# Patient Record
Sex: Female | Born: 1985 | Race: White | Hispanic: No | Smoking: Never smoker
Health system: Southern US, Community
[De-identification: ages and names within clinical notes are randomized; demographics above are authoritative.]

---

## 2019-05-03 DIAGNOSIS — Z03818 Encounter for observation for suspected exposure to other biological agents ruled out: Secondary | ICD-10-CM | POA: Diagnosis not present

## 2019-07-17 ENCOUNTER — Ambulatory Visit: Payer: Self-pay | Attending: Internal Medicine

## 2019-07-17 DIAGNOSIS — Z23 Encounter for immunization: Secondary | ICD-10-CM

## 2019-07-17 NOTE — Progress Notes (Signed)
   Covid-19 Vaccination Clinic  Name:  Jennifer Duke    MRN: [id number] DOB: 1985/07/15  07/17/2019  Jennifer Duke was observed post Covid-19 immunization for 15 minutes without incident. She was provided with Vaccine Information Sheet and instruction to access the V-Safe system.   Jennifer Duke was instructed to call 911 with any severe reactions post vaccine: Marland Kitchen Difficulty breathing  . Swelling of face and throat  . A fast heartbeat  . A bad rash all over body  . Dizziness and weakness   Immunizations Administered    Name Date Dose VIS Date Route   Pfizer COVID-19 Vaccine 07/17/2019  5:47 PM 0.3 mL 04/08/2019 Intramuscular   Manufacturer: [employer], [employer]   Lot: [id number]   NDC: [address]-7

## 2019-08-07 ENCOUNTER — Ambulatory Visit: Payer: Self-pay | Attending: Internal Medicine

## 2019-08-07 DIAGNOSIS — Z23 Encounter for immunization: Secondary | ICD-10-CM

## 2019-08-07 NOTE — Progress Notes (Signed)
   Covid-19 Vaccination Clinic  Name:  Jennifer Duke    MRN: [id number] DOB: 1985/09/26  08/07/2019  Jennifer Duke was observed post Covid-19 immunization for 15 minutes without incident. She was provided with Vaccine Information Sheet and instruction to access the V-Safe system.   Jennifer Duke was instructed to call 911 with any severe reactions post vaccine: Marland Kitchen Difficulty breathing  . Swelling of face and throat  . A fast heartbeat  . A bad rash all over body  . Dizziness and weakness   Immunizations Administered    Name Date Dose VIS Date Route   Pfizer COVID-19 Vaccine 08/07/2019  5:31 PM 0.3 mL 04/08/2019 Intramuscular   Manufacturer: [employer], [employer]   Lot: [id number]   NDC: [address]-9

## 2020-05-03 DIAGNOSIS — U071 COVID-19: Secondary | ICD-10-CM | POA: Diagnosis not present

## 2020-05-16 DIAGNOSIS — R059 Cough, unspecified: Secondary | ICD-10-CM | POA: Diagnosis not present

## 2020-05-16 DIAGNOSIS — J40 Bronchitis, not specified as acute or chronic: Secondary | ICD-10-CM | POA: Diagnosis not present

## 2020-06-22 DIAGNOSIS — Z713 Dietary counseling and surveillance: Secondary | ICD-10-CM | POA: Diagnosis not present

## 2020-06-22 DIAGNOSIS — Z13220 Encounter for screening for lipoid disorders: Secondary | ICD-10-CM | POA: Diagnosis not present

## 2020-06-22 DIAGNOSIS — E669 Obesity, unspecified: Secondary | ICD-10-CM | POA: Diagnosis not present

## 2020-06-22 DIAGNOSIS — Z9989 Dependence on other enabling machines and devices: Secondary | ICD-10-CM | POA: Diagnosis not present

## 2020-06-22 DIAGNOSIS — Z0000 Encounter for general adult medical examination without abnormal findings: Secondary | ICD-10-CM | POA: Diagnosis not present

## 2020-06-22 DIAGNOSIS — Z23 Encounter for immunization: Secondary | ICD-10-CM | POA: Diagnosis not present

## 2020-06-22 DIAGNOSIS — J984 Other disorders of lung: Secondary | ICD-10-CM | POA: Diagnosis not present

## 2020-06-29 DIAGNOSIS — Z01419 Encounter for gynecological examination (general) (routine) without abnormal findings: Secondary | ICD-10-CM | POA: Diagnosis not present

## 2020-07-02 DIAGNOSIS — M25511 Pain in right shoulder: Secondary | ICD-10-CM | POA: Diagnosis not present

## 2020-07-02 NOTE — Progress Notes (Addendum)
07/03/20- 34 yoF never smoker for sleep evaluation courtesy of Aliene Beams, MD with concern of OSA on CPAP Epworth score- Body weight today- Covid vax-3 Phizer Flu vax-none -----Patient wears CPAP already but moved here from [place] and needs to get established with DR and DME. Patient feels like she is not sleeping good, states that she feels exhausted through out the day. States she has not been able to use machine because she needs new supplies. Had been successful with CPAP but nasal pillows masks have worn out. Without CPAP now sleep is not restful and she is taking 2 naps/ day.  No sleep meds. 3 cups coffee. She denies ENT surgery, heart, lung or neurologic problems and is not pregnant. We are requesting original diagnostic study from [facility]. Denies other parasomnias. Female partner tells her of snoring and apneas.  We reviewed sleep apnea, testing and treatment and answered questions.        Apparently at her first study she was told her breathing was somehow "restricted". This was not a PFT and she isn't sure what was meant, but she does not have known lung disease. Apparently this comment related to some aspect of her sleep study- possibly hypopneas. We can evaluate further if needed.  "Self-employed" day shift work.   Addendum 07/10/20- NPSG from  [facility]- AHI 3.1/ hr, desaturation to 92%, body weight 178 lbs. Advice was for conservative emanagement  Prior to Admission medications   Not on File   History reviewed. No pertinent past medical history. History reviewed. No pertinent surgical history. Frankey Shown Social History   Socioeconomic History  . Marital status: Significant Other    Spouse name: Not on file  . Number of children: Not on file  . Years of education: Not on file  . Highest education level: Not on file  Occupational History  . Not on file  Tobacco Use  . Smoking status: Never Smoker  . Smokeless tobacco: Never Used   Vaping Use  . Vaping Use: Never used  Substance and Sexual Activity  . Alcohol use: Not on file  . Drug use: Not on file  . Sexual activity: Not on file  Other Topics Concern  . Not on file  Social History Narrative  . Not on file   Social Determinants of Health   Financial Resource Strain: Not on file  Food Insecurity: Not on file  Transportation Needs: Not on file  Physical Activity: Not on file  Stress: Not on file  Social Connections: Not on file  Intimate Partner Violence: Not on file   ROS-see HPI   + = positive Constitutional:    weight loss, night sweats, fevers, chills, +fatigue, lassitude. HEENT:    headaches, difficulty swallowing, tooth/dental problems, sore throat,       sneezing, itching, ear ache, nasal congestion, post nasal drip, snoring CV:    chest pain, orthopnea, PND, swelling in lower extremities, anasarca,                                  dizziness, palpitations Resp:   shortness of breath with exertion or at rest.                productive cough,   non-productive cough, coughing up of blood.              change in color of mucus.  wheezing.   Skin:  rash or lesions. GI:  No-   heartburn, indigestion, abdominal pain, nausea, vomiting, diarrhea,                 change in bowel habits, loss of appetite GU: dysuria, change in color of urine, no urgency or frequency.   flank pain. MS:   joint pain, stiffness, decreased range of motion, back pain. Neuro-     nothing unusual Psych:  change in mood or affect.  depression or anxiety.   memory loss.  OBJ- Physical Exam General- Alert, Oriented, Affect-appropriate, Distress- none acute Skin- rash-none, lesions- none, excoriation- none Lymphadenopathy- none Head- atraumatic            Eyes- Gross vision intact, PERRLA, conjunctivae and secretions clear            Ears- Hearing, canals-normal            Nose- Clear, no-Septal dev, mucus, polyps, erosion, perforation             Throat- Mallampati III ,  mucosa clear , drainage- none,  tonsils- atrophic, + teeth Neck- flexible , trachea midline, no stridor , thyroid nl, carotid no bruit Chest - symmetrical excursion , unlabored           Heart/CV- RRR , no murmur , no gallop  , no rub, nl s1 s2                           - JVD- none , edema- none, stasis changes- none, varices- none           Lung- clear to P&A, wheeze- none, cough- none , dullness-none, rub- none           Chest wall-  Abd-  Br/ Gen/ Rectal- Not done, not indicated Extrem- cyanosis- none, clubbing, none, atrophy- none, strength- nl Neuro- grossly intact to observation

## 2020-07-03 ENCOUNTER — Encounter: Payer: Self-pay | Admitting: Internal Medicine

## 2020-07-03 ENCOUNTER — Other Ambulatory Visit: Payer: Self-pay

## 2020-07-03 ENCOUNTER — Ambulatory Visit: Payer: BC Managed Care – PPO | Admitting: Internal Medicine

## 2020-07-03 DIAGNOSIS — G4733 Obstructive sleep apnea (adult) (pediatric): Secondary | ICD-10-CM | POA: Insufficient documentation

## 2020-07-03 NOTE — Patient Instructions (Signed)
Order- please schedule home sleep test dx OSA  Please call us about 2 weeks after your sleep test to see if results and recommendations are ready yet. If appropriate, we may be able to start treatment before we see you next.  We will be able then to get you a local DME company and replace your old CPAP machine.  Order- referral to sleep center for mask fitting  Please call anytime if we can help

## 2020-07-03 NOTE — Assessment & Plan Note (Signed)
Relationship of body weight to OSA was reviewed. She reports weight gain 10-15 lbs in recent years, but has started going to a gym. Plan- encourage diet and exercise

## 2020-07-03 NOTE — Assessment & Plan Note (Signed)
Original test result from [place] is being requested. She has not been using CPAP because she lacked DME /source for replacement masks. She will need to update documentation and can do that with a home study. We discussed supply chain problems delaying DME supply of new and replacement machines. To speed things up, we will have mask-fitting done so hopefully she can use a mask from that service until HST/ new DME/ new CPAP. We did discuss responsibility for driving safety, alternatives to CPAP therapy.

## 2020-08-06 DIAGNOSIS — M25511 Pain in right shoulder: Secondary | ICD-10-CM | POA: Diagnosis not present

## 2020-08-31 DIAGNOSIS — D485 Neoplasm of uncertain behavior of skin: Secondary | ICD-10-CM | POA: Diagnosis not present

## 2020-08-31 DIAGNOSIS — I788 Other diseases of capillaries: Secondary | ICD-10-CM | POA: Diagnosis not present

## 2020-08-31 DIAGNOSIS — D225 Melanocytic nevi of trunk: Secondary | ICD-10-CM | POA: Diagnosis not present

## 2020-08-31 DIAGNOSIS — D2261 Melanocytic nevi of right upper limb, including shoulder: Secondary | ICD-10-CM | POA: Diagnosis not present

## 2020-08-31 DIAGNOSIS — D2262 Melanocytic nevi of left upper limb, including shoulder: Secondary | ICD-10-CM | POA: Diagnosis not present

## 2020-08-31 DIAGNOSIS — L814 Other melanin hyperpigmentation: Secondary | ICD-10-CM | POA: Diagnosis not present

## 2020-09-13 DIAGNOSIS — M25562 Pain in left knee: Secondary | ICD-10-CM | POA: Diagnosis not present

## 2020-09-13 DIAGNOSIS — M25511 Pain in right shoulder: Secondary | ICD-10-CM | POA: Diagnosis not present

## 2020-09-25 DIAGNOSIS — L988 Other specified disorders of the skin and subcutaneous tissue: Secondary | ICD-10-CM | POA: Diagnosis not present

## 2020-09-25 DIAGNOSIS — D485 Neoplasm of uncertain behavior of skin: Secondary | ICD-10-CM | POA: Diagnosis not present

## 2020-10-23 DIAGNOSIS — J40 Bronchitis, not specified as acute or chronic: Secondary | ICD-10-CM | POA: Diagnosis not present

## 2020-11-01 ENCOUNTER — Other Ambulatory Visit: Payer: Self-pay | Admitting: *Deleted

## 2020-11-01 DIAGNOSIS — G4733 Obstructive sleep apnea (adult) (pediatric): Secondary | ICD-10-CM

## 2020-11-01 NOTE — Progress Notes (Unsigned)
.  hst

## 2020-11-02 ENCOUNTER — Ambulatory Visit: Payer: BC Managed Care – PPO | Admitting: Internal Medicine

## 2020-12-12 DIAGNOSIS — G4733 Obstructive sleep apnea (adult) (pediatric): Secondary | ICD-10-CM | POA: Diagnosis not present

## 2021-01-02 DIAGNOSIS — R053 Chronic cough: Secondary | ICD-10-CM | POA: Diagnosis not present

## 2021-01-03 ENCOUNTER — Other Ambulatory Visit: Payer: Self-pay | Admitting: Family Medicine

## 2021-01-03 ENCOUNTER — Ambulatory Visit
Admission: RE | Admit: 2021-01-03 | Discharge: 2021-01-03 | Disposition: A | Discharge status: Home / Self Care | Payer: BC Managed Care – PPO | Source: Ambulatory Visit | Attending: Family Medicine | Admitting: Family Medicine

## 2021-01-03 DIAGNOSIS — R053 Chronic cough: Secondary | ICD-10-CM

## 2021-01-03 DIAGNOSIS — R059 Cough, unspecified: Secondary | ICD-10-CM | POA: Diagnosis not present

## 2021-01-09 ENCOUNTER — Telehealth: Payer: Self-pay | Admitting: Internal Medicine

## 2021-01-09 NOTE — Telephone Encounter (Signed)
Ok with me 

## 2021-01-09 NOTE — Telephone Encounter (Signed)
Pt is wanting to change offices from [place] to [place].  CY and KK please advise if provider change is ok with you both.  Thanks

## 2021-01-10 NOTE — Telephone Encounter (Signed)
Noted.   Will route to [place] to f/u for scheduling at next business day.

## 2021-01-11 NOTE — Telephone Encounter (Signed)
Lm for patient to schedule OV with Dr. Belia Heman.

## 2021-01-14 NOTE — Telephone Encounter (Signed)
Appt scheduled for 01/31/2021 at 3:00.  Patient is aware and voiced her understanding.  Nothing further needed at this time.

## 2021-01-31 ENCOUNTER — Encounter: Payer: Self-pay | Admitting: Internal Medicine

## 2021-01-31 ENCOUNTER — Other Ambulatory Visit: Payer: Self-pay

## 2021-01-31 ENCOUNTER — Ambulatory Visit: Payer: BC Managed Care – PPO | Admitting: Internal Medicine

## 2021-01-31 VITALS — BP 126/80 | HR 79 | Temp 98.5°F | Ht 63.0 in | Wt 207.8 lb

## 2021-01-31 DIAGNOSIS — R059 Cough, unspecified: Secondary | ICD-10-CM | POA: Diagnosis not present

## 2021-01-31 MED ORDER — ADVAIR HFA 230-21 MCG/ACT IN AERO: 2.0000 | INHALATION_SPRAY | Freq: Two times a day (BID) | RESPIRATORY_TRACT | 4 refills | Status: DC

## 2021-01-31 NOTE — Progress Notes (Signed)
Chief complaint Patient here for assessment for chronic cough   HPI Patient previously seen by Dr. Fannie Knee and lumbar pulmonary office patient has a previous history of sleep apnea However repeat sleep test with home sleep study has been ordered but not completed Patient sees [facility] in [place] for her underlying hypopneas and is on CPAP therapy  Addendum 07/10/20- NPSG from  05/02/15- [facility]- AHI 3.1/ hr, desaturation to 92%, body weight 178 lbs. Advice was for conservative emanagement  Patient been diagnosed with a viral infection back in November 2021  She had symptoms of sinus congestion and was treated with Z-Pak and prednisone which helped she then developed a persistent cough that is nonproductive in nature intermittently over the last 10 months  At this time she has a persistent dry cough No fevers No active signs of infection Nonproductive cough Family history of CHF  No pets or animals at home Cleans her house very regularly She has not tried any type of inhalers in the past  Chest x-ray September 2022 Independently reviewed by me today No significant findings  Non-smoker No secondhand smoke exposure Self-employed works at home Alcohol use 2-5 drinks per week Episodic GERD   Social History   Socioeconomic History   Marital status: Significant Other    Spouse name: Not on file   Number of children: Not on file   Years of education: Not on file   Highest education level: Not on file  Occupational History   Not on file  Tobacco Use   Smoking status: Never   Smokeless tobacco: Never  Vaping Use   Vaping Use: Never used  Substance and Sexual Activity   Alcohol use: Not on file   Drug use: Not on file   Sexual activity: Not on file  Other Topics Concern   Not on file  Social History Narrative   Not on file   Social Determinants of Health   Financial Resource Strain: Not on file  Food Insecurity: Not on file   Transportation Needs: Not on file  Physical Activity: Not on file  Stress: Not on file  Social Connections: Not on file  Intimate Partner Violence: Not on file    Review of Systems:  Gen:  Denies  fever, sweats, chills weight loss  HEENT: Denies blurred vision, double vision, ear pain, eye pain, hearing loss, nose bleeds, sore throat Cardiac:  No dizziness, chest pain or heaviness, chest tightness,edema, No JVD Resp:+cough, -sputum production, -shortness of breath,-wheezing, -hemoptysis,  Other:  All other systems negative  BP 126/80 (BP Location: Left Arm, Patient Position: Sitting, Cuff Size: Normal)   Pulse 79   Temp 98.5 F (36.9 C) (Oral)   Ht 5\' 3"  (1.6 m)   Wt 207 lb 12.8 oz (94.3 kg)   SpO2 98%   BMI 36.81 kg/m    Physical Examination:   General Appearance: No distress  EYES PERRLA, EOM intact.   NECK Supple, No JVD Pulmonary: normal breath sounds, No wheezing.  CardiovascularNormal S1,S2.  No m/r/g.   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  [facility]  Medical  Director [facility] Medical Director [facility] Cardio-Pulmonary Department

## 2021-01-31 NOTE — Patient Instructions (Signed)
START ADVAIR HFA inhaler therapy  Avoid allergens and second hand smoke

## 2021-03-07 ENCOUNTER — Telehealth: Payer: Self-pay

## 2021-03-07 NOTE — Telephone Encounter (Signed)
Called and LVM in regards to upcoming appointment, would like for her to bring in SD card to CPAP is possible on 11/15.

## 2021-03-12 ENCOUNTER — Other Ambulatory Visit: Payer: Self-pay

## 2021-03-12 ENCOUNTER — Ambulatory Visit: Payer: BC Managed Care – PPO | Admitting: Internal Medicine

## 2021-03-12 ENCOUNTER — Encounter: Payer: Self-pay | Admitting: Internal Medicine

## 2021-03-12 VITALS — BP 124/62 | HR 72 | Temp 98.4°F | Ht 63.0 in | Wt 210.2 lb

## 2021-03-12 DIAGNOSIS — J411 Mucopurulent chronic bronchitis: Secondary | ICD-10-CM

## 2021-03-12 NOTE — Progress Notes (Signed)
SYNOPSIS Patient previously seen by Dr. Fannie Knee and lumbar pulmonary office patient has a previous history of sleep apnea However repeat sleep test with home sleep study has been ordered but not completed Patient sees Jennifer Duke in [place] for her underlying hypopneas and is on CPAP therapy  Addendum 07/10/20- NPSG from  05/02/15- [facility]- AHI 3.1/ hr, desaturation to 92%, body weight 178 lbs. Advice was for conservative emanagement  Patient been diagnosed with a viral infection back in November 2021  She had symptoms of sinus congestion and was treated with Z-Pak and prednisone which helped she then developed a persistent cough that is nonproductive in nature intermittently over the last 10 months  At this time she has a persistent dry cough No fevers No active signs of infection Nonproductive cough Family history of CHF  No pets or animals at home Cleans her house very regularly She has not tried any type of inhalers in the past  Chest x-ray September 2022 Independently reviewed by me today No significant findings  Non-smoker No secondhand smoke exposure Self-employed works at home Alcohol use 2-5 drinks per week Episodic GERD   CC follow up cough  HPI Patient started on ADVAIR HFA 4 weeks ago She feels so much better with her cough Less cough Less SOB Feels much better   No exacerbation at this time No evidence of heart failure at this time No evidence or signs of infection at this time No respiratory distress No fevers, chills, nausea, vomiting, diarrhea No evidence of lower extremity edema No evidence hemoptysis   OFFICE SPIRO SEEMS TO BE WNL  Social History   Socioeconomic History   Marital status: Significant Other    Spouse name: Not on file   Number of children: Not on file   Years of education: Not on file   Highest education level: Not on file  Occupational History   Not on file  Tobacco Use   Smoking status:  Never   Smokeless tobacco: Never  Vaping Use   Vaping Use: Never used  Substance and Sexual Activity   Alcohol use: Not on file   Drug use: Not on file   Sexual activity: Not on file  Other Topics Concern   Not on file  Social History Narrative   Not on file   Social Determinants of Health   Financial Resource Strain: Not on file  Food Insecurity: Not on file  Transportation Needs: Not on file  Physical Activity: Not on file  Stress: Not on file  Social Connections: Not on file  Intimate Partner Violence: Not on file     Review of Systems:  Gen:  Denies  fever, sweats, chills weight loss  HEENT: Denies blurred vision, double vision, ear pain, eye pain, hearing loss, nose bleeds, sore throat Cardiac:  No dizziness, chest pain or heaviness, chest tightness,edema, No JVD Resp:   No cough, -sputum production, -shortness of breath,-wheezing, -hemoptysis,  Other:  All other systems negative   BP 124/62 (BP Location: Left Arm, Patient Position: Sitting, Cuff Size: Normal)   Pulse 72   Temp 98.4 F (36.9 C) (Oral)   Ht 5\' 3"  (1.6 m)   Wt 210 lb 3.2 oz (95.3 kg)   SpO2 99%   BMI 37.24 kg/m    Physical Examination:   General Appearance: No distress  EYES PERRLA, EOM intact.   NECK Supple, No JVD Pulmonary: normal breath sounds, No wheezing.  CardiovascularNormal S1,S2.  No m/r/g.  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  [facility]   Medical Director [facility] Medical Director [facility] Cardio-Pulmonary Department

## 2021-03-12 NOTE — Patient Instructions (Addendum)
Continue AVDAIR AS NEEDED AVOID ALLERGENS

## 2021-07-02 DIAGNOSIS — Z01419 Encounter for gynecological examination (general) (routine) without abnormal findings: Secondary | ICD-10-CM | POA: Diagnosis not present

## 2021-07-02 DIAGNOSIS — Z124 Encounter for screening for malignant neoplasm of cervix: Secondary | ICD-10-CM | POA: Diagnosis not present

## 2021-07-05 DIAGNOSIS — M7918 Myalgia, other site: Secondary | ICD-10-CM | POA: Diagnosis not present

## 2021-07-05 DIAGNOSIS — Z13220 Encounter for screening for lipoid disorders: Secondary | ICD-10-CM | POA: Diagnosis not present

## 2021-07-05 DIAGNOSIS — Z0000 Encounter for general adult medical examination without abnormal findings: Secondary | ICD-10-CM | POA: Diagnosis not present

## 2021-09-03 DIAGNOSIS — D692 Other nonthrombocytopenic purpura: Secondary | ICD-10-CM | POA: Diagnosis not present

## 2021-09-03 DIAGNOSIS — D2261 Melanocytic nevi of right upper limb, including shoulder: Secondary | ICD-10-CM | POA: Diagnosis not present

## 2021-09-03 DIAGNOSIS — D2262 Melanocytic nevi of left upper limb, including shoulder: Secondary | ICD-10-CM | POA: Diagnosis not present

## 2021-09-03 DIAGNOSIS — L578 Other skin changes due to chronic exposure to nonionizing radiation: Secondary | ICD-10-CM | POA: Diagnosis not present

## 2021-10-11 DIAGNOSIS — L237 Allergic contact dermatitis due to plants, except food: Secondary | ICD-10-CM | POA: Diagnosis not present

## 2022-02-28 DIAGNOSIS — R051 Acute cough: Secondary | ICD-10-CM | POA: Diagnosis not present

## 2022-03-03 DIAGNOSIS — R051 Acute cough: Secondary | ICD-10-CM | POA: Diagnosis not present

## 2022-03-03 DIAGNOSIS — Z20822 Contact with and (suspected) exposure to covid-19: Secondary | ICD-10-CM | POA: Diagnosis not present

## 2022-05-06 DIAGNOSIS — T161XXA Foreign body in right ear, initial encounter: Secondary | ICD-10-CM | POA: Diagnosis not present

## 2022-06-18 DIAGNOSIS — F331 Major depressive disorder, recurrent, moderate: Secondary | ICD-10-CM | POA: Diagnosis not present

## 2022-06-18 DIAGNOSIS — F411 Generalized anxiety disorder: Secondary | ICD-10-CM | POA: Diagnosis not present

## 2022-07-09 DIAGNOSIS — F411 Generalized anxiety disorder: Secondary | ICD-10-CM | POA: Diagnosis not present

## 2022-07-09 DIAGNOSIS — F331 Major depressive disorder, recurrent, moderate: Secondary | ICD-10-CM | POA: Diagnosis not present

## 2022-08-14 DIAGNOSIS — Z30433 Encounter for removal and reinsertion of intrauterine contraceptive device: Secondary | ICD-10-CM | POA: Diagnosis not present

## 2022-08-14 DIAGNOSIS — Z01419 Encounter for gynecological examination (general) (routine) without abnormal findings: Secondary | ICD-10-CM | POA: Diagnosis not present

## 2022-08-14 DIAGNOSIS — Z3202 Encounter for pregnancy test, result negative: Secondary | ICD-10-CM | POA: Diagnosis not present

## 2022-08-21 DIAGNOSIS — R5383 Other fatigue: Secondary | ICD-10-CM | POA: Diagnosis not present

## 2022-08-21 DIAGNOSIS — Z0000 Encounter for general adult medical examination without abnormal findings: Secondary | ICD-10-CM | POA: Diagnosis not present

## 2022-08-22 DIAGNOSIS — F411 Generalized anxiety disorder: Secondary | ICD-10-CM | POA: Diagnosis not present

## 2022-08-22 DIAGNOSIS — Z630 Problems in relationship with spouse or partner: Secondary | ICD-10-CM | POA: Diagnosis not present

## 2022-08-25 DIAGNOSIS — F411 Generalized anxiety disorder: Secondary | ICD-10-CM | POA: Diagnosis not present

## 2022-08-25 DIAGNOSIS — F331 Major depressive disorder, recurrent, moderate: Secondary | ICD-10-CM | POA: Diagnosis not present

## 2022-09-08 DIAGNOSIS — Z630 Problems in relationship with spouse or partner: Secondary | ICD-10-CM | POA: Diagnosis not present

## 2022-09-08 DIAGNOSIS — F411 Generalized anxiety disorder: Secondary | ICD-10-CM | POA: Diagnosis not present

## 2022-09-23 DIAGNOSIS — F331 Major depressive disorder, recurrent, moderate: Secondary | ICD-10-CM | POA: Diagnosis not present

## 2022-09-23 DIAGNOSIS — Z630 Problems in relationship with spouse or partner: Secondary | ICD-10-CM | POA: Diagnosis not present

## 2022-09-23 DIAGNOSIS — F411 Generalized anxiety disorder: Secondary | ICD-10-CM | POA: Diagnosis not present

## 2022-09-25 DIAGNOSIS — Z30431 Encounter for routine checking of intrauterine contraceptive device: Secondary | ICD-10-CM | POA: Diagnosis not present

## 2022-10-13 DIAGNOSIS — Z630 Problems in relationship with spouse or partner: Secondary | ICD-10-CM | POA: Diagnosis not present

## 2022-10-13 DIAGNOSIS — F411 Generalized anxiety disorder: Secondary | ICD-10-CM | POA: Diagnosis not present

## 2022-11-26 DIAGNOSIS — S76012A Strain of muscle, fascia and tendon of left hip, initial encounter: Secondary | ICD-10-CM | POA: Diagnosis not present

## 2022-12-23 DIAGNOSIS — F411 Generalized anxiety disorder: Secondary | ICD-10-CM | POA: Diagnosis not present

## 2022-12-23 DIAGNOSIS — F331 Major depressive disorder, recurrent, moderate: Secondary | ICD-10-CM | POA: Diagnosis not present

## 2023-01-01 DIAGNOSIS — M7062 Trochanteric bursitis, left hip: Secondary | ICD-10-CM | POA: Diagnosis not present

## 2023-01-27 DIAGNOSIS — M7062 Trochanteric bursitis, left hip: Secondary | ICD-10-CM | POA: Diagnosis not present

## 2023-03-02 DIAGNOSIS — F331 Major depressive disorder, recurrent, moderate: Secondary | ICD-10-CM | POA: Diagnosis not present

## 2023-03-02 DIAGNOSIS — F411 Generalized anxiety disorder: Secondary | ICD-10-CM | POA: Diagnosis not present

## 2023-04-15 DIAGNOSIS — J029 Acute pharyngitis, unspecified: Secondary | ICD-10-CM | POA: Diagnosis not present

## 2023-05-05 DIAGNOSIS — F411 Generalized anxiety disorder: Secondary | ICD-10-CM | POA: Diagnosis not present

## 2023-05-05 DIAGNOSIS — F331 Major depressive disorder, recurrent, moderate: Secondary | ICD-10-CM | POA: Diagnosis not present

## 2023-05-28 ENCOUNTER — Ambulatory Visit
Admission: RE | Admit: 2023-05-28 | Discharge: 2023-05-28 | Disposition: A | Discharge status: Home / Self Care | Payer: BC Managed Care – PPO | Source: Ambulatory Visit | Attending: Internal Medicine | Admitting: Internal Medicine

## 2023-05-28 ENCOUNTER — Ambulatory Visit
Admission: RE | Admit: 2023-05-28 | Discharge: 2023-05-28 | Disposition: A | Discharge status: Home / Self Care | Payer: BC Managed Care – PPO | Attending: Internal Medicine | Admitting: Internal Medicine

## 2023-05-28 ENCOUNTER — Ambulatory Visit: Payer: BC Managed Care – PPO | Admitting: Internal Medicine

## 2023-05-28 ENCOUNTER — Encounter: Payer: Self-pay | Admitting: Internal Medicine

## 2023-05-28 VITALS — BP 126/86 | HR 93 | Temp 97.6°F | Ht 63.0 in | Wt 211.0 lb

## 2023-05-28 DIAGNOSIS — J45991 Cough variant asthma: Secondary | ICD-10-CM | POA: Insufficient documentation

## 2023-05-28 DIAGNOSIS — J301 Allergic rhinitis due to pollen: Secondary | ICD-10-CM

## 2023-05-28 DIAGNOSIS — K2100 Gastro-esophageal reflux disease with esophagitis, without bleeding: Secondary | ICD-10-CM | POA: Diagnosis not present

## 2023-05-28 DIAGNOSIS — R053 Chronic cough: Secondary | ICD-10-CM | POA: Diagnosis not present

## 2023-05-28 DIAGNOSIS — G4733 Obstructive sleep apnea (adult) (pediatric): Secondary | ICD-10-CM

## 2023-05-28 MED ORDER — PANTOPRAZOLE SODIUM 20 MG PO TBEC: 20.0000 mg | DELAYED_RELEASE_TABLET | Freq: Every day | ORAL | 1 refills | Status: AC

## 2023-05-28 MED ORDER — ALBUTEROL SULFATE HFA 108 (90 BASE) MCG/ACT IN AERS: 2.0000 | INHALATION_SPRAY | Freq: Four times a day (QID) | RESPIRATORY_TRACT | 2 refills | Status: AC | PRN

## 2023-05-28 MED ORDER — CETIRIZINE HCL 10 MG PO TABS: 10.0000 mg | ORAL_TABLET | Freq: Every day | ORAL | 6 refills | Status: AC

## 2023-05-28 MED ORDER — BUDESONIDE-FORMOTEROL FUMARATE 160-4.5 MCG/ACT IN AERO: 2.0000 | INHALATION_SPRAY | Freq: Two times a day (BID) | RESPIRATORY_TRACT | 12 refills | Status: AC

## 2023-05-28 MED ORDER — TRIAMCINOLONE ACETONIDE 55 MCG/ACT NA AERO: 2.0000 | INHALATION_SPRAY | Freq: Two times a day (BID) | NASAL | 2 refills | Status: AC

## 2023-05-28 NOTE — Progress Notes (Signed)
[facility] Consultation         CHIEF COMPLAINT:   Assessment for chronic cough Assessment for excessive daytime sleepiness   HISTORY OF PRESENT ILLNESS   Patient is seen today for problems and issues with sleep related to excessive daytime sleepiness Patient  has been having sleep problems for many years Patient has been having excessive daytime sleepiness for a long time Patient has been having extreme fatigue and tiredness, lack of energy +  snoring every night    Discussed sleep data and reviewed with patient.  Encouraged proper weight management.  Discussed driving precautions and its relationship with hypersomnolence.  Discussed operating dangerous equipment and its relationship with hypersomnolence.  Discussed sleep hygiene, and benefits of a fixed sleep waked time.  The importance of getting eight or more hours of sleep discussed with patient.  Discussed limiting the use of the computer and television before bedtime.  Decrease naps during the day, so night time sleep will become enhanced.  Limit caffeine, and sleep deprivation.  HTN, stroke, and heart failure are potential risk factors.   Discussed risk of untreated sleep apnea including cardiac arrhthymias, stroke, DM, pulm HTN.    EPWORTH SLEEP SCORE 6   Assessment of chronic cough Patient saw me 2 years ago for cough Patient placed on Advair at the time and seem to have helped Over the last couple years she stopped using inhalers At this time her cough has returned mostly in the morning and at night She has tried several nasal sprays also antihistamines with Allegra She has tried reflux medication with Pepcid Patient remains with chronic cough  Patient is self-employed works with [employer], quantity Very active Patient currently weighs 211 pounds at 5 feet 3 Diagnosis of obesity   No exacerbation at this time No evidence of heart failure at this time No evidence or signs of  infection at this time No respiratory distress No fevers, chills, nausea, vomiting, diarrhea No evidence of lower extremity edema No evidence hemoptysis We have discussed significant medications and treatment options Plan for ENT referral for allergy testing and sinus congestion evaluation  Pulmonary function test spirometry with FEV1 and FVC reviewed in detail 05/28/2023 FEV1 was 100% predicted FVC 104% predicted No evidence of obstructive disease  Non-smoker No secondhand smoke exposure Self-employed works at home Alcohol use 2-5 drinks per week Episodic GERD MEDICATIONS    Home Medication:  Current Outpatient Rx  . Order #: [id number] Class: Normal  . Order #: [id number] Class: Normal  . Order #: [id number] Class: Historical Med  . Order #: [id number] Class: Normal  . Order #: [id number] Class: Historical Med  . Order #: [id number] Class: Historical Med  . Order #: [id number] Class: Normal  . Order #: [id number] Class: Historical Med  . Order #: [id number] Class: Normal  . Order #: [id number] Class: Historical Med  . Order #: [id number] Class: Normal    Current Medication:   Current Outpatient Medications:  .  albuterol (VENTOLIN HFA) 108 (90 Base) MCG/ACT inhaler, Inhale 2 puffs into the lungs every 6 (six) hours as needed for wheezing or shortness of breath., Disp: 8 g, Rfl: 2 .  budesonide-formoterol (SYMBICORT) 160-4.5 MCG/ACT inhaler, Inhale 2 puffs into the lungs 2 (two) times daily., Disp: 1 each, Rfl: 12 .  buPROPion (WELLBUTRIN XL) 300 MG 24 hr tablet, Take 300 mg by mouth daily., Disp: , Rfl:  .  cetirizine (ZYRTEC ALLERGY) 10 MG tablet, Take 1 tablet (10 mg total) by mouth daily., Disp: 30 tablet, Rfl: 6 .  Cholecalciferol (  VITAMIN D3) 50 MCG (2000 UT) CAPS, Take 1 capsule by mouth daily in the afternoon., Disp: , Rfl:  .  levonorgestrel (MIRENA) 20 MCG/DAY IUD, 1 each by Intrauterine route once., Disp: , Rfl:  .  pantoprazole (PROTONIX) 20 MG tablet, Take 1 tablet (20 mg total) by  mouth daily., Disp: 30 tablet, Rfl: 1 .  sertraline (ZOLOFT) 100 MG tablet, Take 100 mg by mouth daily., Disp: , Rfl:  .  triamcinolone (NASACORT) 55 MCG/ACT AERO nasal inhaler, Place 2 sprays into the nose 2 (two) times daily., Disp: 1 each, Rfl: 2 .  valACYclovir (VALTREX) 500 MG tablet, Take 500 mg by mouth daily., Disp: , Rfl:  .  fluticasone-salmeterol (ADVAIR HFA) 230-21 MCG/ACT inhaler, Inhale 2 puffs into the lungs 2 (two) times daily. (Patient not taking: Reported on 05/28/2023), Disp: 1 each, Rfl: 4     ALLERGIES   Patient has no known allergies.   BP 126/86 (BP Location: Left Arm, Patient Position: Sitting, Cuff Size: Normal)   Pulse 93   Temp 97.6 F (36.4 C) (Temporal)   Ht 5\' 3"  (1.6 m)   Wt 211 lb (95.7 kg)   SpO2 98%   BMI 37.38 kg/m      Review of Systems: Gen:  Denies  fever, sweats, chills weight loss  HEENT: Denies blurred vision, double vision, ear pain, eye pain, hearing loss, nose bleeds, sore throat Cardiac:  No dizziness, chest pain or heaviness, chest tightness,edema, No JVD Resp:   No cough, -sputum production, -shortness of breath,-wheezing, -hemoptysis,  Other:  All other systems negative   Physical Examination:   General Appearance: No distress  EYES PERRLA, EOM intact.   NECK Supple, No JVD Pulmonary: normal breath sounds, No wheezing.  CardiovascularNormal S1,S2.  No m/r/g.   Abdomen: Benign, Soft, non-tender. Neurology UE/LE 5/5 strength, no focal deficits Ext pulses intact, cap refill intact ALL OTHER ROS ARE NEGATIVE      LABS    No results for input(s): "HGB", "HCT", "MCV", "WBC", "POTASSIUM", "CHLORIDE", "BUN", "CREATININE", "GLUCOSE", "CALCIUM", "INR", "PTT" in the last 72 hours.  Invalid input(s): "PLATELET", "BANDS", "NEUTROPHIL", "LYMPHOCYTE", "MONOCYTE", "EOSINOPHILS", "BASOPHIL", "SODIUM", "BICARBONATE", "MAGNESIUM", "PHOSPHORUS", "PT", "SGPT", "SGOT",    No results for input(s): "PH" in the last 72 hours.  Invalid  input(s): "PCO2", "PO2", "BASEEXCESS", "BASEDEFICITE", "TFT"      ASSESSMENT/PLAN   38 year old pleasant white female seen today for follow-up assessment for chronic cough over the last several years previously treated with inhaler therapy with Advair and albuterol has tried several nasal sprays and antihistamines also has tried Pepcid for underlying reflux but continues to have chronic cough mostly in the mornings and at nighttime, my previous diagnosis was inflammatory bronchitis with eosinophilic bronchitis-but patient has not followed up nor has used her inhaler therapy   Recommend obtaining pulmonary function testing and chest x-ray to assess chronic cough Recommend home sleep study to assess for sleep apnea Recommend starting Nasacort Recommend starting Zyrtec Recommend starting Protonix for GERD Will prescribe Symbicort inhaler use as directed and rinse mouth after every use Continue albuterol inhaler as needed  ENT referral for allergy testing and sinus congestion   Avoid Allergens and Irritants Avoid secondhand smoke Avoid SICK contacts Recommend  Masking  when appropriate Recommend Keep up-to-date with vaccinations  Obesity -recommend significant weight loss -recommend changing diet  Deconditioned state -Recommend increased daily activity and exercise   MEDICATION ADJUSTMENTS/LABS AND TESTS ORDERED: PFTs  Home sleep test Nasacort Symbicort Albuterol Chest x-ray ENT referral  CURRENT MEDICATIONS REVIEWED AT LENGTH WITH PATIENT TODAY   Patient  satisfied with Plan of action and management. All questions answered   Follow up 4 weeks   I spent a total of 48 minutes reviewing chart data, face-to-face evaluation with the patient, counseling and coordination of care as detailed above.      Lucie Leather, M.D.  [facility]  Medical Director [facility] Medical Director [facility] Cardio-Pulmonary Department

## 2023-05-28 NOTE — Patient Instructions (Signed)
Recommend obtaining pulmonary function testing and chest x-ray to assess chronic cough Recommend home sleep study to assess for sleep apnea  Recommend starting Nasacort Recommend starting Zyrtec Recommend starting Protonix for GERD Will prescribe Symbicort inhaler use as directed and rinse mouth after every use Continue albuterol inhaler as needed  ENT referral for allergy testing and sinus congestion  Recommend weight loss  Avoid Allergens and Irritants Avoid secondhand smoke Avoid SICK contacts Recommend  Masking  when appropriate Recommend Keep up-to-date with vaccinations

## 2023-06-01 IMAGING — CR DG CHEST 2V
2 series · 2 of 2 positions shown · non-contrast
Comparison: None.

CLINICAL DATA: 35-year-old female with chronic cough

EXAM:
CHEST - 2 VIEW

[w chest pa]
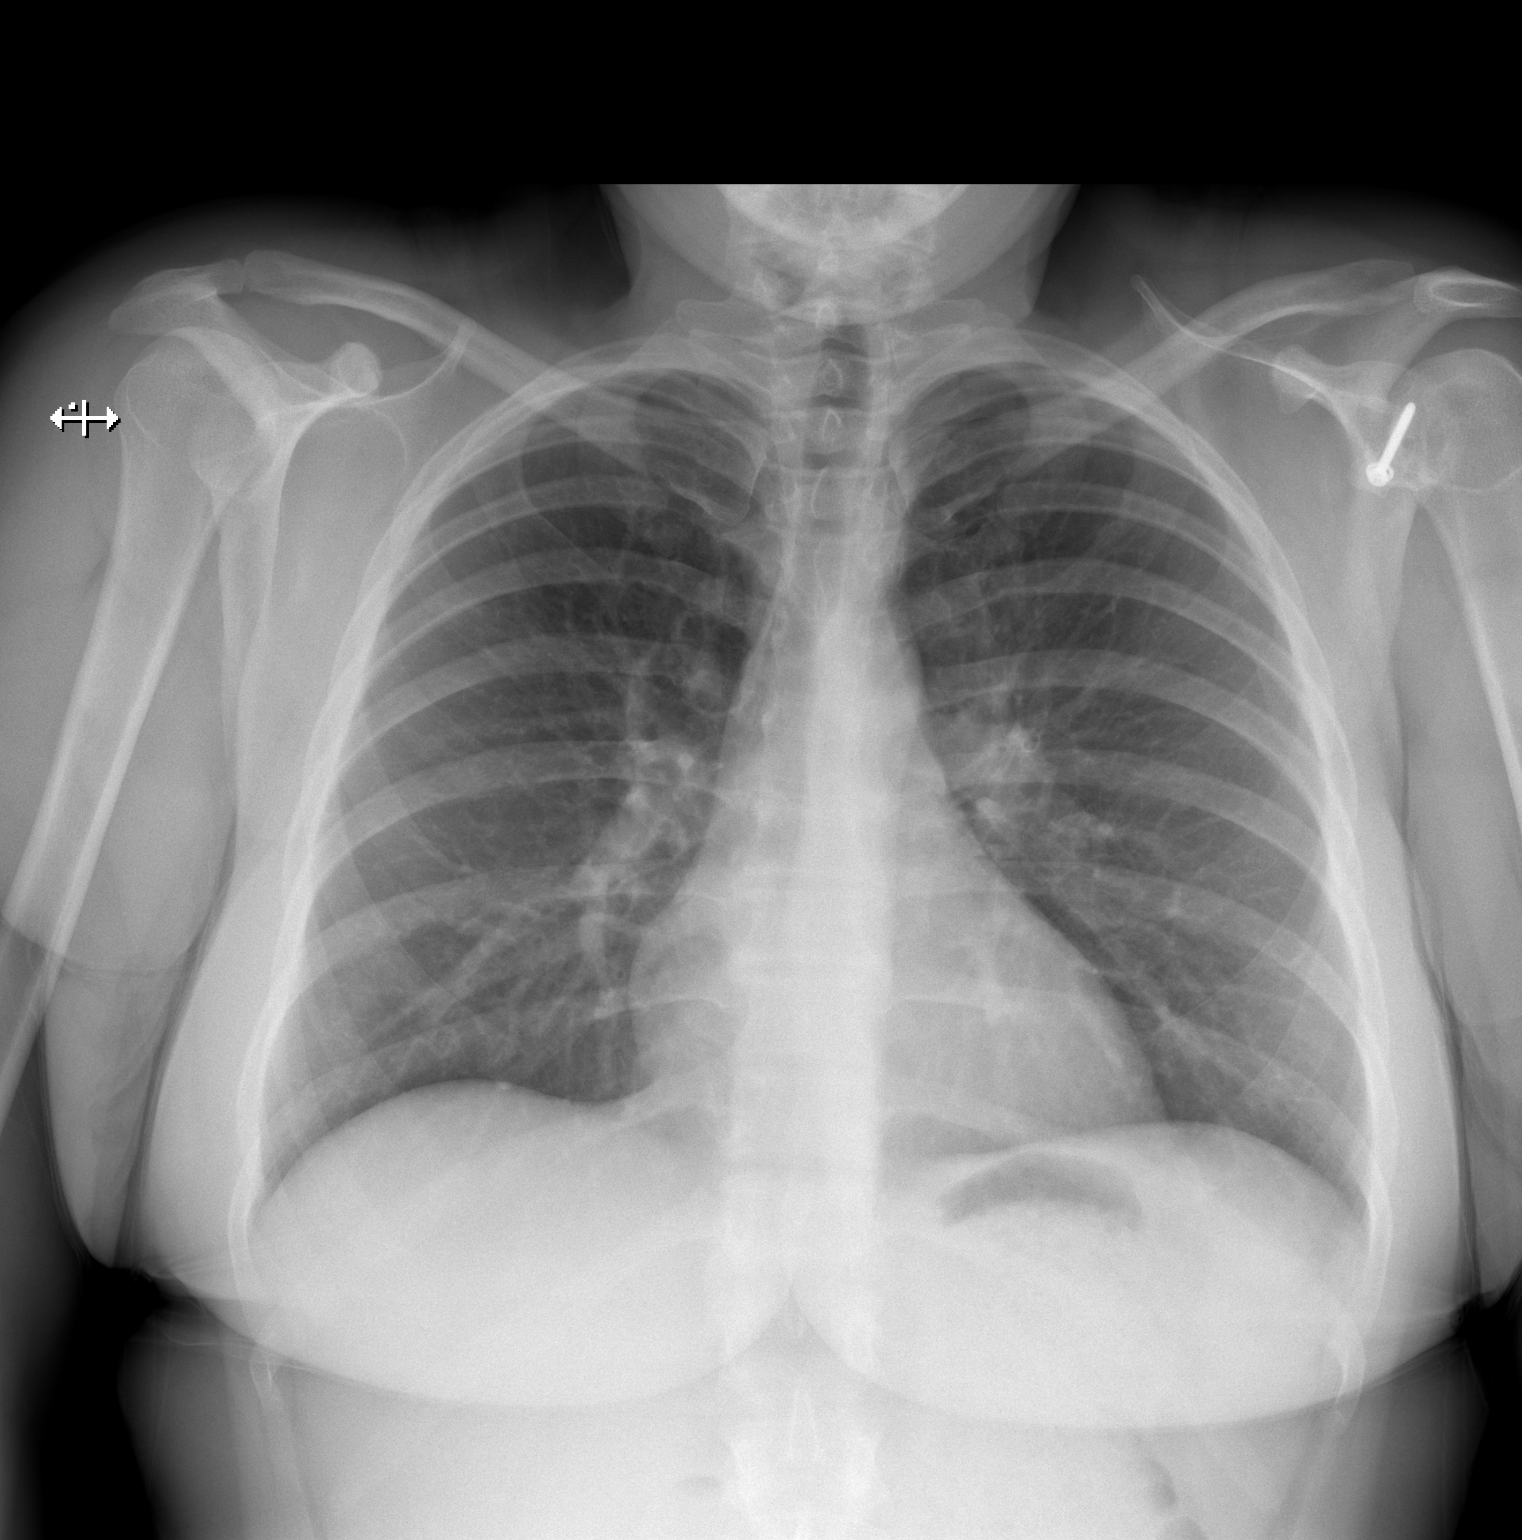

[w chest lat]
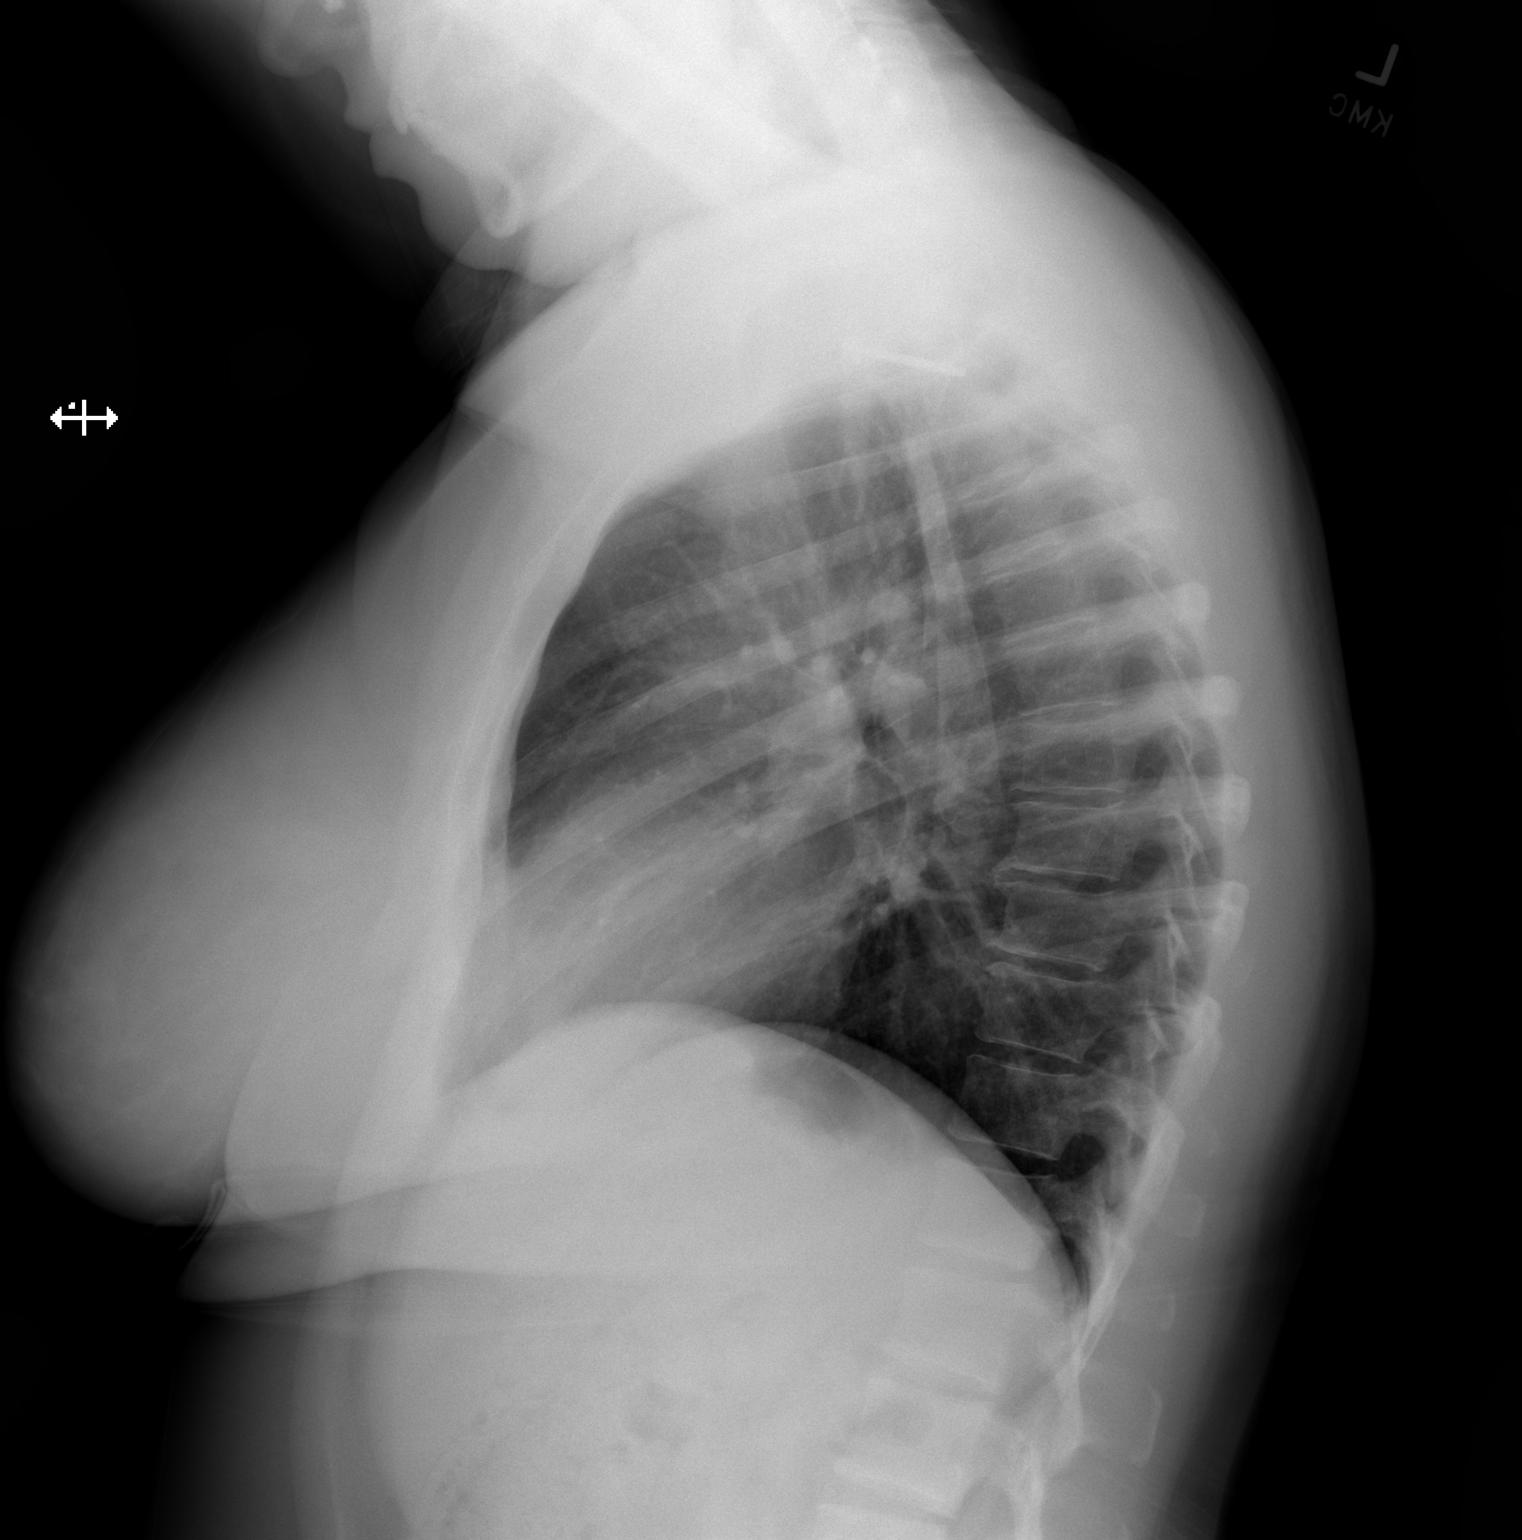

[2 of 2 positions shown; findings below may reference images not displayed]

FINDINGS: Cardiomediastinal silhouette within normal limits in size and
contour. No evidence of central vascular congestion. No interlobular
septal thickening. No pneumothorax or pleural effusion. No confluent
airspace disease.

No acute displaced fracture.  Mild degenerative changes of the spine
IMPRESSION: No active cardiopulmonary disease.

## 2023-06-08 ENCOUNTER — Encounter

## 2023-06-08 DIAGNOSIS — G4733 Obstructive sleep apnea (adult) (pediatric): Secondary | ICD-10-CM

## 2023-06-08 DIAGNOSIS — G4730 Sleep apnea, unspecified: Secondary | ICD-10-CM | POA: Diagnosis not present

## 2023-06-26 DIAGNOSIS — G4733 Obstructive sleep apnea (adult) (pediatric): Secondary | ICD-10-CM | POA: Diagnosis not present

## 2023-06-29 ENCOUNTER — Encounter: Payer: Self-pay | Admitting: Internal Medicine

## 2023-06-29 ENCOUNTER — Ambulatory Visit: Payer: BC Managed Care – PPO | Admitting: Internal Medicine

## 2023-06-29 VITALS — BP 134/96 | HR 69 | Temp 97.6°F | Ht 63.0 in | Wt 210.8 lb

## 2023-06-29 DIAGNOSIS — J309 Allergic rhinitis, unspecified: Secondary | ICD-10-CM | POA: Diagnosis not present

## 2023-06-29 DIAGNOSIS — G4733 Obstructive sleep apnea (adult) (pediatric): Secondary | ICD-10-CM

## 2023-06-29 NOTE — Patient Instructions (Signed)
 Referral to DME company [employer] Recommend Auto CPAP 4-10 CM h20  Recommend weight loss  Recommend ENT referral-we will place order again   Continue Steroid Nasal Spray as prescribed  Avoid Allergens and Irritants Avoid secondhand smoke Avoid SICK contacts Recommend  Masking  when appropriate Recommend Keep up-to-date with vaccinations

## 2023-06-29 NOTE — Progress Notes (Signed)
 [facility] [place] [facility] Medicine Consultation    Tests HST February 2025  AHI 12.5 Chest x-ray January 2025-no significant abnormalities no effusions no pneumonia     CHIEF COMPLAINT:   Follow-up assessment for chronic cough Follow-up assessment for OSA    HISTORY OF PRESENT ILLNESS   Assessment of sleep apnea follow-up Diagnosis of sleep apnea Options relayed to patient Patient has persistent fatigue and daytime somnolence Will plan to start auto CPAP  Discussed sleep data and reviewed with patient.  Encouraged proper weight management.  Discussed driving precautions and its relationship with hypersomnolence.  Discussed operating dangerous equipment and its relationship with hypersomnolence.  Discussed sleep hygiene, and benefits of a fixed sleep waked time.  The importance of getting eight or more hours of sleep discussed with patient.  Discussed limiting the use of the computer and television before bedtime.  Decrease naps during the day, so night time sleep will become enhanced.  Limit caffeine, and sleep deprivation.  HTN, stroke, and heart failure are potential risk factors.   Discussed risk of untreated sleep apnea including cardiac arrhthymias, stroke, DM, pulm HTN.    Assessment of cough After further assessment it seems like patient's symptoms are related to allergic rhinitis and sinus congestion Patient's cough subsides with Nasacort nasal spray Referral to ENT pending Plan to use Advair and albuterol as needed Will plan to stop Protonix and antihistamines  No exacerbation at this time No evidence of heart failure at this time No evidence or signs of infection at this time No respiratory distress No fevers, chills, nausea, vomiting, diarrhea No evidence of lower extremity edema No evidence hemoptysis   Pulmonary function test spirometry with FEV1 and FVC FEV1 was 100% predicted FVC 104% predicted No evidence of obstructive disease  Patient is  self-employed works with [employer], quantity Very active Patient currently weighs 211 pounds at 5 feet 3 Diagnosis of obesity  Non-smoker No secondhand smoke exposure Self-employed works at home Alcohol use 2-5 drinks per week Episodic GERD MEDICATIONS    Home Medication:  Current Outpatient Rx   Order #: [id number] Class: Normal   Order #: [id number] Class: Normal   Order #: [id number] Class: Historical Med   Order #: [id number] Class: Normal   Order #: [id number] Class: Historical Med   Order #: [id number] Class: Normal   Order #: [id number] Class: Historical Med   Order #: [id number] Class: Normal   Order #: [id number] Class: Historical Med   Order #: [id number] Class: Normal   Order #: [id number] Class: Historical Med    Current Medication:   Current Outpatient Medications:    albuterol (VENTOLIN HFA) 108 (90 Base) MCG/ACT inhaler, Inhale 2 puffs into the lungs every 6 (six) hours as needed for wheezing or shortness of breath., Disp: 8 g, Rfl: 2   budesonide-formoterol (SYMBICORT) 160-4.5 MCG/ACT inhaler, Inhale 2 puffs into the lungs 2 (two) times daily., Disp: 1 each, Rfl: 12   buPROPion (WELLBUTRIN XL) 300 MG 24 hr tablet, Take 300 mg by mouth daily., Disp: , Rfl:    cetirizine (ZYRTEC ALLERGY) 10 MG tablet, Take 1 tablet (10 mg total) by mouth daily., Disp: 30 tablet, Rfl: 6   Cholecalciferol (VITAMIN D3) 50 MCG (2000 UT) CAPS, Take 1 capsule by mouth daily in the afternoon., Disp: , Rfl:    fluticasone-salmeterol (ADVAIR HFA) 230-21 MCG/ACT inhaler, Inhale 2 puffs into the lungs 2 (two) times daily. (Patient not taking: Reported on 05/28/2023), Disp: 1 each, Rfl: 4   levonorgestrel (MIRENA) 20 MCG/DAY IUD, 1 each by Intrauterine route once., Disp: ,  Rfl:    pantoprazole (PROTONIX) 20 MG tablet, Take 1 tablet (20 mg total) by mouth daily., Disp: 30 tablet, Rfl: 1   sertraline (ZOLOFT) 100 MG tablet, Take 100 mg by mouth daily., Disp: , Rfl:    triamcinolone (NASACORT) 55 MCG/ACT AERO nasal  inhaler, Place 2 sprays into the nose 2 (two) times daily., Disp: 1 each, Rfl: 2   valACYclovir (VALTREX) 500 MG tablet, Take 500 mg by mouth daily., Disp: , Rfl:      ALLERGIES   Patient has no known allergies.   BP (!) 134/96 (BP Location: Left Arm, Patient Position: Sitting, Cuff Size: Normal)   Pulse 69   Temp 97.6 F (36.4 C) (Temporal)   Ht 5\' 3"  (1.6 m)   Wt 210 lb 12.8 oz (95.6 kg)   SpO2 99%   BMI 37.34 kg/m     Review of Systems: Gen:  Denies  fever, sweats, chills weight loss  HEENT: Denies blurred vision, double vision, ear pain, eye pain, hearing loss, nose bleeds, sore throat Cardiac:  No dizziness, chest pain or heaviness, chest tightness,edema, No JVD Resp:   No cough, -sputum production, -shortness of breath,-wheezing, -hemoptysis,  Other:  All other systems negative   Physical Examination:   General Appearance: No distress  EYES PERRLA, EOM intact.   NECK Supple, No JVD Pulmonary: normal breath sounds, No wheezing.  CardiovascularNormal S1,S2.  No m/r/g.   Abdomen: Benign, Soft, non-tender. Neurology UE/LE 5/5 strength, no focal deficits Ext pulses intact, cap refill intact ALL OTHER ROS ARE NEGATIVE      LABS    No results for input(s): "HGB", "HCT", "MCV", "WBC", "POTASSIUM", "CHLORIDE", "BUN", "CREATININE", "GLUCOSE", "CALCIUM", "INR", "PTT" in the last 72 hours.  Invalid input(s): "PLATELET", "BANDS", "NEUTROPHIL", "LYMPHOCYTE", "MONOCYTE", "EOSINOPHILS", "BASOPHIL", "SODIUM", "BICARBONATE", "MAGNESIUM", "PHOSPHORUS", "PT", "SGPT", "SGOT",    No results for input(s): "PH" in the last 72 hours.  Invalid input(s): "PCO2", "PO2", "BASEEXCESS", "BASEDEFICITE", "TFT"   Chest x-ray May 28, 2023 Independently reviewed by me today No acute abnormalities no effusions no pneumonia No significant findings   ASSESSMENT/PLAN   38 year old pleasant white female seen today for follow-up assessment for chronic cough over the last several  years likely related to allergic rhinitis, ENT referral pending . Patient with a diagnosis of sleep apnea with AHI of 12, patient with signs and symptoms of excessive daytime sleepiness patient will need to be started on auto CPAP therapy   Assessment of sleep apnea Diagnosis of sleep apnea with AHI of 12.5 Signs and symptoms of excessive daytime sleepiness and snoring and fatigue Recommend starting auto CPAP 4-10 Referral to DME company [id number]  Patient Instructions  Continue to use CPAP every night, minimum of 4-6 hours a night.  Change equipment every 30 days or as directed by DME.  Wash your tubing with warm soap and water daily, hang to dry. Wash humidifier portion weekly. Use bottled, distilled water and change daily   Be aware of reduced alertness and do not drive or operate heavy machinery if experiencing this or drowsiness.  Exercise encouraged, as tolerated. Encouraged proper weight management.  Important to get eight or more hours of sleep  Limiting the use of the computer and television before bedtime.  Decrease naps during the day, so night time sleep will become enhanced.  Limit caffeine, and sleep deprivation.  HTN, stroke, uncontrolled diabetes and heart failure are potential risk factors.  Risk of untreated sleep apnea including cardiac arrhthymias, stroke, DM,  pulm HTN.   Allergic rhinitis Continue nasal steroid as prescribed ENT referral for allergy testing and sinus congestion placed again Avoid Allergens and Irritants Avoid secondhand smoke Avoid SICK contacts Recommend  Masking  when appropriate Recommend Keep up-to-date with vaccinations  Obesity -recommend significant weight loss -recommend changing diet  Deconditioned state -Recommend increased daily activity and exercise    MEDICATION ADJUSTMENTS/LABS AND TESTS ORDERED: Referral to DME company [employer] Recommend Auto CPAP 4-10 CM  h20 Recommend weight loss Recommend ENT referral-we will place order again Continue Steroid Nasal Spray as prescribed  CURRENT MEDICATIONS REVIEWED AT LENGTH WITH PATIENT TODAY   Patient  satisfied with Plan of action and management. All questions answered   Follow up  3 months   I spent a total of 43 minutes reviewing chart data, face-to-face evaluation with the patient, counseling and coordination of care as detailed above.      Lucie Leather, M.D.  [facility]  Medical Director [facility] Medical Director [facility] Cardio-Pulmonary Department

## 2023-07-28 DIAGNOSIS — F411 Generalized anxiety disorder: Secondary | ICD-10-CM | POA: Diagnosis not present

## 2023-07-28 DIAGNOSIS — F331 Major depressive disorder, recurrent, moderate: Secondary | ICD-10-CM | POA: Diagnosis not present

## 2023-07-29 DIAGNOSIS — G4733 Obstructive sleep apnea (adult) (pediatric): Secondary | ICD-10-CM | POA: Diagnosis not present

## 2023-08-25 DIAGNOSIS — Z01419 Encounter for gynecological examination (general) (routine) without abnormal findings: Secondary | ICD-10-CM | POA: Diagnosis not present

## 2023-08-28 DIAGNOSIS — G4733 Obstructive sleep apnea (adult) (pediatric): Secondary | ICD-10-CM | POA: Diagnosis not present

## 2023-09-17 DIAGNOSIS — J3089 Other allergic rhinitis: Secondary | ICD-10-CM | POA: Diagnosis not present

## 2023-09-17 DIAGNOSIS — R5383 Other fatigue: Secondary | ICD-10-CM | POA: Diagnosis not present

## 2023-09-17 DIAGNOSIS — Z13220 Encounter for screening for lipoid disorders: Secondary | ICD-10-CM | POA: Diagnosis not present

## 2023-09-17 DIAGNOSIS — R053 Chronic cough: Secondary | ICD-10-CM | POA: Diagnosis not present

## 2023-09-17 DIAGNOSIS — Z0000 Encounter for general adult medical examination without abnormal findings: Secondary | ICD-10-CM | POA: Diagnosis not present

## 2023-09-28 DIAGNOSIS — G4733 Obstructive sleep apnea (adult) (pediatric): Secondary | ICD-10-CM | POA: Diagnosis not present

## 2023-10-08 ENCOUNTER — Telehealth: Payer: Self-pay | Admitting: Internal Medicine

## 2023-10-08 NOTE — Telephone Encounter (Signed)
 Patient needs PFT scheduled prior to 10/20/2023.

## 2023-10-14 DIAGNOSIS — R053 Chronic cough: Secondary | ICD-10-CM | POA: Diagnosis not present

## 2023-10-19 ENCOUNTER — Ambulatory Visit: Admitting: Internal Medicine

## 2023-10-19 ENCOUNTER — Other Ambulatory Visit: Payer: Self-pay

## 2023-10-19 DIAGNOSIS — R0602 Shortness of breath: Secondary | ICD-10-CM

## 2023-10-20 ENCOUNTER — Ambulatory Visit: Admitting: Internal Medicine

## 2023-10-20 ENCOUNTER — Encounter: Payer: Self-pay | Admitting: Internal Medicine

## 2023-10-20 VITALS — BP 110/60 | HR 65 | Temp 97.7°F | Ht 63.0 in | Wt 206.6 lb

## 2023-10-20 DIAGNOSIS — J45991 Cough variant asthma: Secondary | ICD-10-CM

## 2023-10-20 DIAGNOSIS — R0602 Shortness of breath: Secondary | ICD-10-CM | POA: Diagnosis not present

## 2023-10-20 DIAGNOSIS — G4733 Obstructive sleep apnea (adult) (pediatric): Secondary | ICD-10-CM | POA: Diagnosis not present

## 2023-10-20 LAB — PULMONARY FUNCTION TEST
DL/VA % pred: 114 %
DL/VA: 5.11 ml/min/mmHg/L
DLCO unc % pred: 124 %
DLCO unc: 26.4 ml/min/mmHg
FEF 25-75 Post: 3.79 L/s
FEF 25-75 Pre: 3.16 L/s
FEF2575-%Change-Post: 19 %
FEF2575-%Pred-Post: 121 %
FEF2575-%Pred-Pre: 101 %
FEV1-%Change-Post: 4 %
FEV1-%Pred-Post: 112 %
FEV1-%Pred-Pre: 107 %
FEV1-Post: 3.31 L
FEV1-Pre: 3.16 L
FEV1FVC-%Change-Post: 3 %
FEV1FVC-%Pred-Pre: 97 %
FEV6-%Change-Post: 1 %
FEV6-%Pred-Post: 112 %
FEV6-%Pred-Pre: 110 %
FEV6-Post: 3.96 L
FEV6-Pre: 3.91 L
FEV6FVC-%Change-Post: 0 %
FEV6FVC-%Pred-Post: 101 %
FEV6FVC-%Pred-Pre: 101 %
FVC-%Change-Post: 1 %
FVC-%Pred-Post: 110 %
FVC-%Pred-Pre: 109 %
FVC-Post: 3.96 L
FVC-Pre: 3.92 L
Post FEV1/FVC ratio: 84 %
Post FEV6/FVC ratio: 100 %
Pre FEV1/FVC ratio: 81 %
Pre FEV6/FVC Ratio: 100 %
RV % pred: 86 %
RV: 1.31 L
TLC % pred: 109 %
TLC: 5.38 L

## 2023-10-20 MED ORDER — BUDESONIDE-FORMOTEROL FUMARATE 160-4.5 MCG/ACT IN AERO
2.0000 | INHALATION_SPRAY | Freq: Two times a day (BID) | RESPIRATORY_TRACT | 12 refills | Status: AC
Start: 2023-10-20 — End: ?

## 2023-10-20 NOTE — Progress Notes (Unsigned)
 [facility] Consultation    Tests HST February 2025  AHI 12.5 Chest x-ray January 2025-no significant abnormalities no effusions no pneumonia     CHIEF COMPLAINT:   Follow-up assessment for chronic cough Follow-up assessment for OSA    HISTORY OF PRESENT ILLNESS   Assessment of sleep apnea follow-up  Discussed sleep data and reviewed with patient.  Encouraged proper weight management.  Discussed driving precautions and its relationship with hypersomnolence.  Discussed sleep hygiene, and benefits of a fixed sleep waked time.  The importance of getting eight or more hours of sleep discussed with patient.  Discussed limiting the use of the computer and television before bedtime.  Decrease naps during the day, so night time sleep will become enhanced.  Limit caffeine, and sleep deprivation.   Patient uses and benefits from therapy Using CPAP nightly and with naps Pressure setting is comfortable and is sleeping well. Auto CPAP 4-10 AHI reduced to 0.6 100% compliance   Assessment of cough After further assessment it seems like patient's symptoms are related to allergic rhinitis and sinus congestion Patient's cough subsides with Nasacort  nasal spray ENT follow-up assessment did not reveal any significant findings Plan to restart Symbicort  for possible cough variant asthma  Pulmonary function testing October 20, 2023 reviewed in detail with patient FEV1 FVC postbronchodilator ratio 97% predicted No significant bronchodilator response FEV1 was 3.3 L at 112% predicted  FVC is 3.96 L at 110% predicted Total lung capacity is 109% predicted  RV is 86% predicted  DLCO is 124% predicted  Flow-volume loops did not show any restrictive or obstructive physiology pattern Interpretation no significant obstructive or restrictive disease however there may be evidence of underlying reactive airways disease with elevated DLCO consistent with asthma  No exacerbation at  this time No evidence of heart failure at this time No evidence or signs of infection at this time No respiratory distress No fevers, chills, nausea, vomiting, diarrhea No evidence of lower extremity edema No evidence hemoptysis    Patient is self-employed works with [employer], quantity Very active Patient currently weighs 211 pounds at 5 feet 3 Diagnosis of obesity  Non-smoker No secondhand smoke exposure Self-employed works at home Alcohol use 2-5 drinks per week Episodic GERD MEDICATIONS    Home Medication:  Current Outpatient Rx   Order #: [id number] Class: Normal   Order #: [id number] Class: Normal   Order #: [id number] Class: Historical Med   Order #: [id number] Class: Normal   Order #: [id number] Class: Historical Med   Order #: [id number] Class: Historical Med   Order #: [id number] Class: Normal   Order #: [id number] Class: Historical Med   Order #: [id number] Class: Normal   Order #: [id number] Class: Historical Med    Current Medication:   Current Outpatient Medications:    albuterol  (VENTOLIN  HFA) 108 (90 Base) MCG/ACT inhaler, Inhale 2 puffs into the lungs every 6 (six) hours as needed for wheezing or shortness of breath., Disp: 8 g, Rfl: 2   budesonide -formoterol  (SYMBICORT ) 160-4.5 MCG/ACT inhaler, Inhale 2 puffs into the lungs 2 (two) times daily., Disp: 1 each, Rfl: 12   buPROPion (WELLBUTRIN XL) 300 MG 24 hr tablet, Take 300 mg by mouth daily., Disp: , Rfl:    cetirizine  (ZYRTEC  ALLERGY) 10 MG tablet, Take 1 tablet (10 mg total) by mouth daily., Disp: 30 tablet, Rfl: 6   Cholecalciferol (VITAMIN D3) 50 MCG (2000 UT) CAPS, Take 1 capsule by mouth daily in the afternoon., Disp: , Rfl:    levonorgestrel (MIRENA) 20 MCG/DAY IUD, 1 each  by Intrauterine route once., Disp: , Rfl:    pantoprazole  (PROTONIX ) 20 MG tablet, Take 1 tablet (20 mg total) by mouth daily., Disp: 30 tablet, Rfl: 1   sertraline (ZOLOFT) 100 MG tablet, Take 100 mg by mouth daily., Disp: , Rfl:     triamcinolone  (NASACORT ) 55 MCG/ACT AERO nasal inhaler, Place 2 sprays into the nose 2 (two) times daily., Disp: 1 each, Rfl: 2   valACYclovir (VALTREX) 500 MG tablet, Take 500 mg by mouth daily., Disp: , Rfl:      ALLERGIES   Patient has no known allergies.  BP 110/60 (BP Location: Right Arm, Patient Position: Sitting, Cuff Size: Large)   Pulse 65   Temp 97.7 F (36.5 C) (Oral)   Ht 5' 3 (1.6 m)   Wt 206 lb 9.6 oz (93.7 kg)   SpO2 98%   BMI 36.60 kg/m       Review of Systems: Gen:  Denies  fever, sweats, chills weight loss  HEENT: Denies blurred vision, double vision, ear pain, eye pain, hearing loss, nose bleeds, sore throat Cardiac:  No dizziness, chest pain or heaviness, chest tightness,edema, No JVD Resp:   No cough, -sputum production, -shortness of breath,-wheezing, -hemoptysis,  Other:  All other systems negative   Physical Examination:   General Appearance: No distress  EYES PERRLA, EOM intact.   NECK Supple, No JVD Pulmonary: normal breath sounds, No wheezing.  CardiovascularNormal S1,S2.  No m/r/g.   Abdomen: Benign, Soft, non-tender. Neurology UE/LE 5/5 strength, no focal deficits Ext pulses intact, cap refill intact ALL OTHER ROS ARE NEGATIVE       LABS    No results for input(s): HGB, HCT, MCV, WBC, POTASSIUM, CHLORIDE, BUN, CREATININE, GLUCOSE, CALCIUM, INR, PTT in the last 72 hours.  Invalid input(s): PLATELET, BANDS, NEUTROPHIL, LYMPHOCYTE, MONOCYTE, EOSINOPHILS, BASOPHIL, SODIUM, BICARBONATE, MAGNESIUM, PHOSPHORUS, PT, SGPT, SGOT,    No results for input(s): PH in the last 72 hours.  Invalid input(s): PCO2, PO2, BASEEXCESS, BASEDEFICITE, TFT   Chest x-ray May 28, 2023 Independently reviewed by me today No acute abnormalities no effusions no pneumonia No significant findings   ASSESSMENT/PLAN   38 year old pleasant white female seen today for follow-up assessment  for chronic cough over the last several years likely related to allergic rhinitis,  Patient with a diagnosis of sleep apnea with AHI of 12, patient with probable underlying cough variant asthma   Assessment of OSA Previous AHI 12 Continue CPAP as prescribed  Excellent compliance report Reviewed compliance report in detail with patient Patient definitely benefits the use of CPAP therapy as prescribed Using CPAP nightly and with naps Pressure setting is comfortable and is sleeping well. CPAP prescription 4-10 AHI reduced to 0  No evidence of acute heart failure at this time No respiratory distress No fevers, chills, nausea, vomiting, diarrhea No evidence hemoptysis  Patient Instructions Continue to use CPAP every night, minimum of 4-6 hours a night.  Change equipment every 30 days or as directed by DME.  Wash your tubing with warm soap and water daily, hang to dry. Wash humidifier portion weekly. Use bottled, distilled water and change daily   Be aware of reduced alertness and do not drive or operate heavy machinery if experiencing this or drowsiness.  Exercise encouraged, as tolerated. Encouraged proper weight management.  Important to get eight or more hours of sleep  Limiting the use of the computer and television before bedtime.  Decrease naps during the day, so night time sleep will become enhanced.  Limit caffeine, and sleep deprivation.  HTN, stroke, uncontrolled diabetes and heart failure are potential risk factors.  Risk of untreated sleep apnea including cardiac arrhthymias, stroke, DM, pulm HTN.   Allergic rhinitis/COUGH VARIANT ASTHMA Continue nasal steroid as prescribed Avoid Allergens and Irritants Avoid secondhand smoke Avoid SICK contacts Recommend  Masking  when appropriate Recommend Keep up-to-date with vaccinations Follow-up allergy immunology next month  Obesity -recommend significant weight loss -recommend changing diet  Deconditioned  state -Recommend increased daily activity and exercise     MEDICATION ADJUSTMENTS/LABS AND TESTS ORDERED: Continue CPAP as prescribed Restart Symbicort  Rinse mouth after use Recommend weight loss Avoid Allergens and Irritants Avoid secondhand smoke Avoid SICK contacts Recommend  Masking  when appropriate Recommend Keep up-to-date with vaccinations   CURRENT MEDICATIONS REVIEWED AT LENGTH WITH PATIENT TODAY   Patient  satisfied with Plan of action and management. All questions answered   Follow-up in 3 months   I spent a total of 43 minutes reviewing chart data, face-to-face evaluation with the patient, counseling and coordination of care as detailed above.      Nickolas Alm Cellar, M.D.  [facility]  Medical Director [facility] Medical Director [facility] Cardio-Pulmonary Department

## 2023-10-20 NOTE — Patient Instructions (Signed)
 Excellent Job A+ GOLD STAR!!  Continue CPAP as prescribed  Patient Instructions Continue to use CPAP every night, minimum of 4-6 hours a night.  Change equipment every 30 days or as directed by DME.  Wash your tubing with warm soap and water daily, hang to dry. Wash humidifier portion weekly. Use bottled, distilled water and change daily   Be aware of reduced alertness and do not drive or operate heavy machinery if experiencing this or drowsiness.  Exercise encouraged, as tolerated. Encouraged proper weight management.  Important to get eight or more hours of sleep  Limiting the use of the computer and television before bedtime.  Decrease naps during the day, so night time sleep will become enhanced.  Limit caffeine, and sleep deprivation.    Avoid Allergens and Irritants Avoid secondhand smoke Avoid SICK contacts Recommend  Masking  when appropriate Recommend Keep up-to-date with vaccinations  Restart SYmbicort  2 puffs twice daily Please rinse mouth after every use Use albuterol  prior to exertion

## 2023-10-20 NOTE — Patient Instructions (Signed)
 Full PFT performed today.

## 2023-10-20 NOTE — Progress Notes (Signed)
 Full PFT performed today.

## 2023-10-28 DIAGNOSIS — G4733 Obstructive sleep apnea (adult) (pediatric): Secondary | ICD-10-CM | POA: Diagnosis not present

## 2023-11-06 DIAGNOSIS — R400 Somnolence: Secondary | ICD-10-CM | POA: Diagnosis not present

## 2023-11-06 DIAGNOSIS — G4733 Obstructive sleep apnea (adult) (pediatric): Secondary | ICD-10-CM | POA: Diagnosis not present

## 2023-11-09 DIAGNOSIS — F331 Major depressive disorder, recurrent, moderate: Secondary | ICD-10-CM | POA: Diagnosis not present

## 2023-11-09 DIAGNOSIS — F411 Generalized anxiety disorder: Secondary | ICD-10-CM | POA: Diagnosis not present

## 2023-11-09 DIAGNOSIS — F951 Chronic motor or vocal tic disorder: Secondary | ICD-10-CM | POA: Diagnosis not present

## 2023-11-10 DIAGNOSIS — R052 Subacute cough: Secondary | ICD-10-CM | POA: Diagnosis not present

## 2023-11-10 DIAGNOSIS — J3089 Other allergic rhinitis: Secondary | ICD-10-CM | POA: Diagnosis not present

## 2024-01-11 DIAGNOSIS — F331 Major depressive disorder, recurrent, moderate: Secondary | ICD-10-CM | POA: Diagnosis not present

## 2024-01-11 DIAGNOSIS — F411 Generalized anxiety disorder: Secondary | ICD-10-CM | POA: Diagnosis not present

## 2024-01-11 DIAGNOSIS — E669 Obesity, unspecified: Secondary | ICD-10-CM | POA: Diagnosis not present

## 2024-01-11 DIAGNOSIS — F951 Chronic motor or vocal tic disorder: Secondary | ICD-10-CM | POA: Diagnosis not present

## 2024-01-20 ENCOUNTER — Ambulatory Visit: Admitting: Internal Medicine

## 2024-04-06 DIAGNOSIS — F411 Generalized anxiety disorder: Secondary | ICD-10-CM | POA: Diagnosis not present

## 2024-04-06 DIAGNOSIS — F331 Major depressive disorder, recurrent, moderate: Secondary | ICD-10-CM | POA: Diagnosis not present

## 2024-04-06 DIAGNOSIS — F951 Chronic motor or vocal tic disorder: Secondary | ICD-10-CM | POA: Diagnosis not present

## 2024-04-06 DIAGNOSIS — E669 Obesity, unspecified: Secondary | ICD-10-CM | POA: Diagnosis not present
